# Patient Record
Sex: Male | Born: 2000 | Race: Black or African American | Hispanic: No | Marital: Single | Smoking: Never smoker
Health system: Southern US, Community
[De-identification: ages and names within clinical notes are randomized; demographics above are authoritative.]

## PROBLEM LIST (undated history)

## (undated) ENCOUNTER — Emergency Department: Payer: Medicaid Other

## (undated) DIAGNOSIS — J45909 Unspecified asthma, uncomplicated: Secondary | ICD-10-CM

---
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
## 2020-03-10 ENCOUNTER — Encounter (HOSPITAL_COMMUNITY): Payer: Self-pay

## 2020-03-10 ENCOUNTER — Ambulatory Visit (HOSPITAL_COMMUNITY)
Admission: EM | Admit: 2020-03-10 | Discharge: 2020-03-10 | Disposition: A | Discharge status: Home / Self Care | Payer: Medicaid Other | Attending: Family Medicine | Admitting: Family Medicine

## 2020-03-10 ENCOUNTER — Other Ambulatory Visit: Payer: Self-pay

## 2020-03-10 DIAGNOSIS — H6592 Unspecified nonsuppurative otitis media, left ear: Secondary | ICD-10-CM

## 2020-03-10 MED ORDER — CETIRIZINE HCL 10 MG PO TABS: 10.0000 mg | ORAL_TABLET | Freq: Every day | ORAL | 2 refills | Status: AC

## 2020-03-10 MED ORDER — PREDNISONE 50 MG PO TABS: ORAL_TABLET | ORAL | 0 refills | Status: AC

## 2020-03-10 MED ORDER — FLUTICASONE PROPIONATE 50 MCG/ACT NA SUSP: 1.0000 | Freq: Two times a day (BID) | NASAL | 2 refills | Status: AC

## 2020-03-10 NOTE — ED Provider Notes (Signed)
[facility]    CSN: [id number] Arrival date & time: 03/10/20  4801      History   Chief Complaint Chief Complaint  Patient presents with  . Tinnitus    HPI Carl Harvey is a 19 y.o. male.   Presenting today with 2 weeks of ear pressure and muffled hearing, now some ringing in the ear on the left. Denies ear pain, drainage, fever, chills, headache, N/V. Tried some vicks nasal spray without benefit. Does have a hx of allergic rhinitis, not on anything for this.      Past Medical History:  Diagnosis Date  . Asthma     There are no problems to display for this patient.   History reviewed. No pertinent surgical history.     Home Medications    Prior to Admission medications   Medication Sig Start Date End Date Taking? Authorizing Provider  bismuth subsalicylate (PEPTO BISMOL) 262 MG/15ML suspension Take 30 mLs by mouth every 6 (six) hours as needed for diarrhea or loose stools.    [provider]  cetirizine (ZYRTEC ALLERGY) 10 MG tablet Take 1 tablet (10 mg total) by mouth daily. 03/10/20   Particia Nearing, PA-C  fluticasone [facility]) 50 MCG/ACT nasal spray Place 1 spray into both nostrils 2 (two) times daily. 03/10/20   Particia Nearing, PA-C  ondansetron (ZOFRAN) 4 MG tablet Take 1 tablet (4 mg total) by mouth every 6 (six) hours as needed for nausea. 07/05/13   Lowanda Foster, NP  predniSONE (DELTASONE) 50 MG tablet Take 1 tab daily with breakfast for 3 days 03/10/20   Particia Nearing, PA-C    Family History History reviewed. No pertinent family history.  Social History Social History   Tobacco Use  . Smoking status: Never Smoker  . Smokeless tobacco: Never Used  Substance Use Topics  . Alcohol use: Never  . Drug use: Never     Allergies   Patient has no known allergies.   Review of Systems Review of Systems PER HPI   Physical Exam Triage Vital Signs ED Triage Vitals  Enc Vitals Group     BP 03/10/20  0931 (!) 137/7     Pulse Rate 03/10/20 0931 72     Resp 03/10/20 0931 15     Temp 03/10/20 0931 97.6 F (36.4 C)     Temp Source 03/10/20 0931 Oral     SpO2 03/10/20 0931 100 %     Weight --      Height --      Head Circumference --      Peak Flow --      Pain Score 03/10/20 0930 0     Pain Loc --      Pain Edu? --      Excl. in GC? --    No data found.  Updated Vital Signs BP (!) 137/7 (BP Location: Right Arm)   Pulse 72   Temp 97.6 F (36.4 C) (Oral)   Resp 15   SpO2 100%   Visual Acuity Right Eye Distance:   Left Eye Distance:   Bilateral Distance:    Right Eye Near:   Left Eye Near:    Bilateral Near:     Physical Exam Vitals and nursing note reviewed.  Constitutional:      Appearance: Normal appearance.  HENT:     Head: Atraumatic.     Right Ear: Tympanic membrane and ear canal normal.     Ears:  Comments: Left middle ear effusion present, no TM injection or erythema    Nose:     Comments: B/l nasal turbinates boggy, erythematous, rhinorrhea present    Mouth/Throat:     Mouth: Mucous membranes are moist.     Pharynx: Posterior oropharyngeal erythema present.  Eyes:     Extraocular Movements: Extraocular movements intact.     Conjunctiva/sclera: Conjunctivae normal.  Cardiovascular:     Rate and Rhythm: Normal rate and regular rhythm.     Heart sounds: Normal heart sounds.  Pulmonary:     Effort: Pulmonary effort is normal.     Breath sounds: Normal breath sounds.  Musculoskeletal:        General: Normal range of motion.     Cervical back: Normal range of motion and neck supple.  Skin:    General: Skin is warm and dry.  Neurological:     General: No focal deficit present.     Mental Status: He is oriented to person, place, and time.  Psychiatric:        Mood and Affect: Mood normal.        Thought Content: Thought content normal.        Judgment: Judgment normal.      UC Treatments / Results  Labs (all labs ordered are listed, but  only abnormal results are displayed) Labs Reviewed - No data to display  EKG   Radiology No results found.  Procedures Procedures (including critical care time)  Medications Ordered in UC Medications - No data to display  Initial Impression / Assessment and Plan / UC Course  I have reviewed the triage vital signs and the nursing notes.  Pertinent labs & imaging results that were available during my care of the patient were reviewed by me and considered in my medical decision making (see chart for details).     Tx with short prednisone burst, allergy regimen with zyrtec and flonase daily, and discussed sinus rinses and other supportive home care. Suspect from poorly controlled allergic rhinitis. Return precautions reviewed.   Final Clinical Impressions(s) / UC Diagnoses   Final diagnoses:  Left otitis media with effusion   Discharge Instructions   None    ED Prescriptions    Medication Sig Dispense Auth. Provider   predniSONE (DELTASONE) 50 MG tablet Take 1 tab daily with breakfast for 3 days 3 tablet Particia Nearing, PA-C   fluticasone [facility]) 50 MCG/ACT nasal spray Place 1 spray into both nostrils 2 (two) times daily. 16 g Particia Nearing, [place]   cetirizine (ZYRTEC ALLERGY) 10 MG tablet Take 1 tablet (10 mg total) by mouth daily. 30 tablet Particia Nearing, [place]     PDMP not reviewed this encounter.   Particia Nearing, [place] 03/10/20 [phone]

## 2020-03-10 NOTE — ED Triage Notes (Signed)
Pt presents hearing decrease in left ear x 2 weeks, ringing in the left ear started today. Denies pain, fever.
